# Patient Record
Sex: Male | Born: 1995 | Race: Black or African American | Hispanic: No | Marital: Single | Smoking: Never smoker
Health system: Southern US, Community
[De-identification: ages and names within clinical notes are randomized; demographics above are authoritative.]

## PROBLEM LIST (undated history)

## (undated) DIAGNOSIS — H9012 Conductive hearing loss, unilateral, left ear, with unrestricted hearing on the contralateral side: Secondary | ICD-10-CM

## (undated) DIAGNOSIS — H7192 Unspecified cholesteatoma, left ear: Secondary | ICD-10-CM

## (undated) HISTORY — PX: TYMPANOPLASTY: SHX33

---
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
## 2018-11-04 ENCOUNTER — Ambulatory Visit (HOSPITAL_COMMUNITY)
Admission: EM | Admit: 2018-11-04 | Discharge: 2018-11-04 | Disposition: A | Discharge status: Home / Self Care | Payer: Self-pay | Attending: Family Medicine | Admitting: Family Medicine

## 2018-11-04 ENCOUNTER — Encounter (HOSPITAL_COMMUNITY): Payer: Self-pay | Admitting: Emergency Medicine

## 2018-11-04 DIAGNOSIS — Z202 Contact with and (suspected) exposure to infections with a predominantly sexual mode of transmission: Secondary | ICD-10-CM

## 2018-11-04 DIAGNOSIS — K5900 Constipation, unspecified: Secondary | ICD-10-CM

## 2018-11-04 MED ORDER — CEFTRIAXONE SODIUM 250 MG IJ SOLR
INTRAMUSCULAR | Status: AC
  Filled 2018-11-04: qty 250

## 2018-11-04 MED ORDER — AZITHROMYCIN 250 MG PO TABS
ORAL_TABLET | ORAL | Status: AC
  Filled 2018-11-04: qty 4

## 2018-11-04 MED ORDER — AZITHROMYCIN 250 MG PO TABS
1000.0000 mg | ORAL_TABLET | Freq: Once | ORAL | Status: AC
  Administered 2018-11-04: 1000 mg via ORAL

## 2018-11-04 MED ORDER — CEFTRIAXONE SODIUM 250 MG IJ SOLR
250.0000 mg | Freq: Once | INTRAMUSCULAR | Status: AC
  Administered 2018-11-04: 250 mg via INTRAMUSCULAR

## 2018-11-04 NOTE — Discharge Instructions (Addendum)
You have been treated today for exposure to sexually transmitted diseases.  Additional tests is pending; we will call you if you need additional treatment.   Avoid sexual intercourse for 7 days needed. All your sexual partners should be evaluated and possibly treated.  For your constipation, you can take over-the-counter MiraLAX or a stool softener.  Return here or follow-up with your primary care provider if you experience fever, chills, abdominal pain, problems urinating.

## 2018-11-04 NOTE — ED Provider Notes (Signed)
[facility]    CSN: [id number] Arrival date & time: 11/04/18  1056     History   Chief Complaint Chief Complaint  Patient presents with  . Generalized Body Aches  . Fatigue  . Constipation    HPI Brendan Mccullough is a 23 y.o. male.   Presents today with constipation, body aches, penile discharge, and dysuria.  He states he believes he had a fever 2 days ago which has resolved.  Last BM 3 days ago.  He is sexually active with multiple partners.  He denies abdominal pain, vomiting, diarrhea, cough, congestion.  The history is provided by the patient.    Past Medical History:  Diagnosis Date  . 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   Past Surgical History:  Procedure Laterality Date  . TYMPANOMASTOIDECTOMY  04/25/2012   Procedure: TYMPANOMASTOIDECTOMY;  Surgeon: Serena ColonelJefry Rosen, MD;  Location: [facility];  Service: ENT;  Laterality: Right;  . TYMPANOPLASTY  07/26/2006   left  . TYMPANOPLASTY     with atticotomy right ear       Home Medications    Prior to Admission medications   Medication Sig Start Date End Date Taking? Authorizing Provider  ciprofloxacin-dexamethasone (CIPRODEX) otic suspension Place 3 drops into the right ear 3 (three) times daily. 04/25/12   Serena Colonelosen, Jefry, MD  HYDROcodone-acetaminophen (NORCO) 7.5-325 MG per tablet Take 1 tablet by mouth every 6 (six) hours as needed for pain. 04/25/12   Serena Colonelosen, Jefry, MD  promethazine (PHENERGAN) 25 MG suppository Place 1 suppository (25 mg total) rectally every 6 (six) hours as needed for nausea. 04/25/12   Serena Colonelosen, Jefry, MD    Family History Family History  Problem Relation Age of Onset  . Diabetes Father   . Hypertension Father     Social History Social History   Tobacco Use  . Smoking status: Never Smoker  . Smokeless tobacco: Never Used  Substance Use Topics  .  Alcohol use: No  . Drug use: No     Allergies   Patient has no known allergies.   Review of Systems Review of Systems  Constitutional: Positive for chills and fever.  HENT: Negative for ear pain and sore throat.   Eyes: Negative for pain and visual disturbance.  Respiratory: Negative for cough and shortness of breath.   Cardiovascular: Negative for chest pain and palpitations.  Gastrointestinal: Positive for constipation. Negative for abdominal pain and vomiting.  Genitourinary: Positive for discharge. Negative for dysuria and hematuria.  Musculoskeletal: Negative for arthralgias and back pain.  Skin: Negative for color change and rash.  Neurological: Negative for seizures and syncope.  All other systems reviewed and are negative.    Physical Exam Triage Vital Signs ED Triage Vitals [11/04/18 1154]  Enc Vitals Group     BP 117/73     Pulse Rate 60     Resp 18     Temp 97.8 F (36.6 C)     Temp src      SpO2 100 %     Weight      Height      Head Circumference      Peak Flow      Pain Score 0     Pain Loc      Pain Edu?      Excl. in GC?    No data found.  Updated  Vital Signs BP 117/73   Pulse 60   Temp 97.8 F (36.6 C)   Resp 18   SpO2 100%   Visual Acuity Right Eye Distance:   Left Eye Distance:   Bilateral Distance:    Right Eye Near:   Left Eye Near:    Bilateral Near:     Physical Exam Vitals signs and nursing note reviewed.  Constitutional:      Appearance: He is well-developed.  HENT:     Head: Normocephalic and atraumatic.     Right Ear: Tympanic membrane normal.     Left Ear: Tympanic membrane normal.     Mouth/Throat:     Mouth: Mucous membranes are moist.  Eyes:     Conjunctiva/sclera: Conjunctivae normal.  Neck:     Musculoskeletal: Neck supple.  Cardiovascular:     Rate and Rhythm: Normal rate and regular rhythm.  Pulmonary:     Effort: Pulmonary effort is normal. No respiratory distress.     Breath sounds: Normal breath  sounds.  Abdominal:     General: There is no distension.     Palpations: Abdomen is soft.     Tenderness: There is no abdominal tenderness. There is no right CVA tenderness, left CVA tenderness, guarding or rebound.  Genitourinary:    Penis: Normal.      Scrotum/Testes: Normal.  Skin:    General: Skin is warm and dry.  Neurological:     Mental Status: He is alert.      UC Treatments / Results  Labs (all labs ordered are listed, but only abnormal results are displayed) Labs Reviewed  [place]    EKG   Radiology No results found.  Procedures Procedures (including critical care time)  Medications Ordered in UC Medications  cefTRIAXone (ROCEPHIN) injection 250 mg (250 mg Intramuscular Given 11/04/18 1252)  azithromycin (ZITHROMAX) tablet 1,000 mg (1,000 mg Oral Given 11/04/18 1253)  azithromycin (ZITHROMAX) 250 MG tablet (has no administration in time range)  cefTRIAXone (ROCEPHIN) 250 MG injection (has no administration in time range)    Initial Impression / Assessment and Plan / UC Course  I have reviewed the triage vital signs and the nursing notes.  Pertinent labs & imaging results that were available during my care of the patient were reviewed by me and considered in my medical decision making (see chart for details).   Exposure to sexually transmitted diseases.  Treated today with Rocephin and Zithromax.  Urine sent.  Discussed with patient that if he needs additional treatment, we will call him.  Discussed need for safe sex practices.  Discussed that his partners will need treatment also and that he will need to abstain from sex for 7 days. Constipation.  Discussed over-the-counter treatments with patient such as MiraLAX and stool softeners. Final Clinical Impressions(s) / UC Diagnoses   Final diagnoses:  Exposure to sexually transmitted disease (STD)  Constipation, unspecified constipation type     Discharge Instructions     You have been  treated today for exposure to sexually transmitted diseases.  Additional tests is pending; we will call you if you need additional treatment.   Avoid sexual intercourse for 7 days needed. All your sexual partners should be evaluated and possibly treated.  For your constipation, you can take over-the-counter MiraLAX or a stool softener.    ED Prescriptions    None     Controlled Substance Prescriptions [place] Controlled Substance Registry consulted? Not Applicable   Sharion Balloon, NP 11/04/18 1336

## 2018-11-04 NOTE — ED Triage Notes (Signed)
Pt states for the last few weeks he "doesn't feel well". Pt endorses body aches, feeling really "hot and sweating". Pt also c/o constipation. Denies pain at this time.

## 2018-11-08 LAB — URINE CYTOLOGY ANCILLARY ONLY
Chlamydia: NEGATIVE
Neisseria Gonorrhea: NEGATIVE
Trichomonas: NEGATIVE

## 2018-11-10 ENCOUNTER — Telehealth (HOSPITAL_COMMUNITY): Payer: Self-pay | Admitting: Emergency Medicine

## 2018-11-10 NOTE — Telephone Encounter (Signed)
Pt called asking about test results. All negative. All questions answered.

## 2018-11-24 ENCOUNTER — Other Ambulatory Visit: Payer: Self-pay

## 2018-11-24 ENCOUNTER — Emergency Department (HOSPITAL_COMMUNITY)
Admission: EM | Admit: 2018-11-24 | Discharge: 2018-11-24 | Disposition: A | Discharge status: Home / Self Care | Payer: Self-pay | Attending: Emergency Medicine | Admitting: Emergency Medicine

## 2018-11-24 ENCOUNTER — Emergency Department (HOSPITAL_COMMUNITY): Payer: Self-pay

## 2018-11-24 DIAGNOSIS — R369 Urethral discharge, unspecified: Secondary | ICD-10-CM

## 2018-11-24 DIAGNOSIS — K5900 Constipation, unspecified: Secondary | ICD-10-CM | POA: Insufficient documentation

## 2018-11-24 DIAGNOSIS — R360 Urethral discharge without blood: Secondary | ICD-10-CM | POA: Insufficient documentation

## 2018-11-24 LAB — COMPREHENSIVE METABOLIC PANEL
ALT: 13 U/L (ref 0–44)
AST: 17 U/L (ref 15–41)
Albumin: 4.4 g/dL (ref 3.5–5.0)
Alkaline Phosphatase: 54 U/L (ref 38–126)
Anion gap: 9 (ref 5–15)
BUN: 14 mg/dL (ref 6–20)
CO2: 24 mmol/L (ref 22–32)
Calcium: 9.6 mg/dL (ref 8.9–10.3)
Chloride: 102 mmol/L (ref 98–111)
Creatinine, Ser: 1.07 mg/dL (ref 0.61–1.24)
GFR calc Af Amer: >60 mL/min (ref >60)
GFR calc non Af Amer: >60 mL/min (ref >60)
Glucose, Bld: 91 mg/dL (ref 70–99)
Potassium: 4 mmol/L (ref 3.5–5.1)
Sodium: 135 mmol/L (ref 135–145)
Total Bilirubin: 1.3 mg/dL — ABNORMAL HIGH (ref 0.3–1.2)
Total Protein: 7.1 g/dL (ref 6.5–8.1)

## 2018-11-24 LAB — URINALYSIS, ROUTINE W REFLEX MICROSCOPIC
Bilirubin Urine: NEGATIVE
Glucose, UA: NEGATIVE mg/dL
Hgb urine dipstick: NEGATIVE
Ketones, ur: NEGATIVE mg/dL
Leukocytes,Ua: NEGATIVE
Nitrite: NEGATIVE
Protein, ur: NEGATIVE mg/dL
Specific Gravity, Urine: 1.01 (ref 1.005–1.030)
pH: 6 (ref 5.0–8.0)

## 2018-11-24 LAB — CBC
HCT: 41.4 % (ref 39.0–52.0)
Hemoglobin: 14.8 g/dL (ref 13.0–17.0)
MCH: 28.4 pg (ref 26.0–34.0)
MCHC: 35.7 g/dL (ref 30.0–36.0)
MCV: 79.3 fL — ABNORMAL LOW (ref 80.0–100.0)
Platelets: 204 10*3/uL (ref 150–400)
RBC: 5.22 MIL/uL (ref 4.22–5.81)
RDW: 13.3 % (ref 11.5–15.5)
WBC: 5.5 10*3/uL (ref 4.0–10.5)
nRBC: 0 % (ref 0.0–0.2)

## 2018-11-24 LAB — LIPASE, BLOOD: Lipase: 25 U/L (ref 11–51)

## 2018-11-24 MED ORDER — AZITHROMYCIN 250 MG PO TABS
1000.0000 mg | ORAL_TABLET | Freq: Once | ORAL | Status: AC
  Administered 2018-11-24: 1000 mg via ORAL
  Filled 2018-11-24: qty 4

## 2018-11-24 MED ORDER — CEFTRIAXONE SODIUM 250 MG IJ SOLR
250.0000 mg | Freq: Once | INTRAMUSCULAR | Status: AC
  Administered 2018-11-24: 250 mg via INTRAMUSCULAR
  Filled 2018-11-24: qty 250

## 2018-11-24 MED ORDER — SODIUM CHLORIDE 0.9% FLUSH: 3.0000 mL | Freq: Once | INTRAVENOUS | Status: DC

## 2018-11-24 MED ORDER — LIDOCAINE HCL (PF) 1 % IJ SOLN
INTRAMUSCULAR | Status: AC
  Administered 2018-11-24: 20:00:00 5 mL
  Filled 2018-11-24: qty 5

## 2018-11-24 NOTE — Discharge Instructions (Signed)
May take 2 scoops of MiraLAX in 8 ounces of water for constipation.  May repeat after 8 hours if no bowel movement.  Also make sure to take in adequate fluids to help with constipation.  If your symptoms are unresolved please follow-up with PCP or health department for your penile discharge.  Return for new or worsening symptoms.

## 2018-11-24 NOTE — ED Provider Notes (Signed)
[facility] Provider Note   CSN: [id number] Arrival date & time: 11/24/18  1622   History   Chief Complaint Chief Complaint  Patient presents with   SEXUALLY TRANSMITTED DISEASE   Constipation    HPI Brendan Mccullough is a 23 y.o. male with past medical history significant for hearing loss who presents for evaluation of constipation and penile discharge.  Patient states he was seen at urgent care approximately 3 weeks ago for similar symptoms.  He was told this is likely due to an STD and received antibiotics.  Patient states he has had intermittent, clear penile discharge since.  Patient states he is still struggling with constipation and "abdominal fullness."  Patient states he normally struggles with constipation and only has bowel movements approximately once a week.  Patient states it has been about a week since his last bowel movement.  Patient states he is passing gas.  Tolerating p.o. intake without nausea or vomiting.  Patient states he has not taken anything at home for his constipation.  Denies fever, chills, nausea, vomiting, chest pain, shortness of breath, dysuria, flank pain, pain with bowel movements, testicular swelling, penile rash or lesions.  Denies hematuria.  Patient states he is sexually active with multiple partners.  He occasionally uses protection.  History obtained from patient and past medical records.  No interpreter was used.     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   Past Surgical History:  Procedure Laterality Date   TYMPANOMASTOIDECTOMY  04/25/2012   Procedure: TYMPANOMASTOIDECTOMY;  Surgeon: Serena ColonelJefry Rosen, MD;  Location: [facility];  Service: ENT;  Laterality: Right;   TYMPANOPLASTY  07/26/2006   left   TYMPANOPLASTY     with  atticotomy right ear        Home Medications    Prior to Admission medications   Medication Sig Start Date End Date Taking? Authorizing Provider  ciprofloxacin-dexamethasone (CIPRODEX) otic suspension Place 3 drops into the right ear 3 (three) times daily. 04/25/12   Serena Colonelosen, Jefry, MD  HYDROcodone-acetaminophen (NORCO) 7.5-325 MG per tablet Take 1 tablet by mouth every 6 (six) hours as needed for pain. 04/25/12   Serena Colonelosen, Jefry, MD  promethazine (PHENERGAN) 25 MG suppository Place 1 suppository (25 mg total) rectally every 6 (six) hours as needed for nausea. 04/25/12   Serena Colonelosen, Jefry, MD    Family History Family History  Problem Relation Age of Onset   Diabetes Father    Hypertension Father     Social History Social History   Tobacco Use   Smoking status: Never Smoker   Smokeless tobacco: Never Used  Substance Use Topics   Alcohol use: No   Drug use: No     Allergies   Patient has no known allergies.   Review of Systems Review of Systems  Constitutional: Negative.   HENT: Negative.   Respiratory: Negative.   Cardiovascular: Negative.   Gastrointestinal: Positive for abdominal distention and constipation. Negative for abdominal pain, anal bleeding, blood in stool, diarrhea, nausea, rectal pain and vomiting.  Genitourinary: Positive for discharge. Negative for decreased urine volume, difficulty urinating, dysuria, flank pain, frequency, genital sores, hematuria, penile pain, penile swelling, scrotal swelling, testicular pain and urgency.  Musculoskeletal: Negative.   Skin: Negative.   Neurological: Negative.   All other systems reviewed and  are negative.    Physical Exam Updated Vital Signs BP 117/84    Pulse (!) 56    Temp 98.7 F (37.1 C) (Oral)    Resp 16    SpO2 100%   Physical Exam Vitals signs and nursing note reviewed. Exam conducted with a chaperone present.  Constitutional:      General: He is not in acute distress.    Appearance: He is  well-developed. He is not ill-appearing, toxic-appearing or diaphoretic.  HENT:     Head: Normocephalic and atraumatic.     Nose: Nose normal.     Mouth/Throat:     Mouth: Mucous membranes are moist.     Pharynx: Oropharynx is clear.  Eyes:     Pupils: Pupils are equal, round, and reactive to light.  Neck:     Musculoskeletal: Normal range of motion and neck supple.  Cardiovascular:     Rate and Rhythm: Normal rate and regular rhythm.     Pulses: Normal pulses.     Heart sounds: Normal heart sounds. No murmur. No friction rub. No gallop.   Pulmonary:     Effort: Pulmonary effort is normal. No respiratory distress.     Breath sounds: Normal breath sounds.  Abdominal:     General: Bowel sounds are normal. There is no distension.     Palpations: Abdomen is soft.     Tenderness: There is no abdominal tenderness. There is no right CVA tenderness, left CVA tenderness, guarding or rebound.     Hernia: No hernia is present. There is no hernia in the left inguinal area or right inguinal area.     Comments: Soft, nontender without rebound or guarding.  No overlying skin changes to abdominal wall.  Genitourinary:    Scrotum/Testes: Normal. Cremasteric reflex is present.     Epididymis:     Right: Normal.     Left: Normal.     Comments: No penile discharge.  No testicular swelling or tenderness.  No epididymal tenderness.  Cremasteric reflex present.  No evidence of inguinal hernias. Musculoskeletal: Normal range of motion.  Lymphadenopathy:     Lower Body: No right inguinal adenopathy. No left inguinal adenopathy.  Skin:    General: Skin is warm and dry.     Comments: No rashes or lesions.  Brisk capillary refill.  Neurological:     Mental Status: He is alert.     Comments: Ambulatory without difficulty.    ED Treatments / Results  Labs (all labs ordered are listed, but only abnormal results are displayed) Labs Reviewed  COMPREHENSIVE METABOLIC PANEL - Abnormal; Notable for the  following components:      Result Value   Total Bilirubin 1.3 (*)    All other components within normal limits  CBC - Abnormal; Notable for the following components:   MCV 79.3 (*)    All other components within normal limits  LIPASE, BLOOD  URINALYSIS, ROUTINE W REFLEX MICROSCOPIC  HIV ANTIBODY (ROUTINE TESTING W REFLEX)  RPR  GC/CHLAMYDIA PROBE AMP ([place]    EKG None  Radiology Dg Abdomen 1 View  Result Date: 11/24/2018 CLINICAL DATA:  Constipation EXAM: ABDOMEN - 1 VIEW COMPARISON:  None. FINDINGS: Scattered large and small bowel gas is noted. No obstructive changes are seen. Minimal retained fecal material is noted without evidence of constipation. No free air is seen. No abnormal mass or abnormal calcifications are noted. No bony abnormality is seen. IMPRESSION: No acute abnormality noted. Electronically Signed  By: Alcide CleverMark  Lukens M.D.   On: 11/24/2018 18:33    Procedures Procedures (including critical care time)  Medications Ordered in ED Medications  sodium chloride flush (NS) 0.9 % injection 3 mL (has no administration in time range)  azithromycin (ZITHROMAX) tablet 1,000 mg (has no administration in time range)  cefTRIAXone (ROCEPHIN) injection 250 mg (has no administration in time range)   Initial Impression / Assessment and Plan / ED Course  I have reviewed the triage vital signs and the nursing notes.  Pertinent labs & imaging results that were available during my care of the patient were reviewed by me and considered in my medical decision making (see chart for details).  23 year old male appears otherwise well presents for evaluation of constipation and penile discharge.  Was seen by urgent care 3 weeks ago for similar symptoms.  He is afebrile, nonseptic, non-ill-appearing.  Sx initially resolved however now has continued intermittent clear penile discharge since he was given antibiotics.  Per records review his GC and chlamydia test was negative  at that time.  He is sexually active with multiple partners and does not use protection.  His abdomen is soft, nontender without rebound or guarding.  Patient is also having issues with constipation.  He denies pain with bowel movements however states he normally only has bowel movements once a week.  States he feels abdominal fullness without any focal tenderness.  Denies pain with bowel movements.  GU exam unremarkable.  No penile discharge.  Labs obtained from triage  Labs and imaging personally reviewed CBC without leukocytosis Metabolic panel with mild hyperbilirubinemia at 1.3.  He has no right upper quadrant tenderness palpation.  I have low suspicion for cholecystitis or gallbladder pathology.  No additional elevation of LFTs to suggest CBD stone, choledocholithiasis or cholangitis. Lipase 25 Urinalysis negative for infection HIV/rpr pending GC/ Chlamydia pending DG abd without dense of obstruction or perforation.  Patient is afebrile without abdominal tenderness or painful bowel movements to indicate prostatitis.  No tenderness to palpation of the testes or epididymis to suggest orchitis or epididymitis.  STD cultures obtained including HIV, syphilis, gonorrhea and chlamydia. Patient to be discharged with instructions to follow up with PCP. Discussed importance of using protection when sexually active. Pt understands that they have GC/Chlamydia cultures pending and that they will need to inform all sexual partners if results return positive. Patient has been treated prophylactically with azithromycin and Rocephin.  Discussed with patient bowel regimen for his chronic constipation.  He was actually able to have a bowel movement in emergency department just prior to x-ray without any difficulty.  He declined GU exam to assess for infection, prostatitis.  Patient is nontoxic, nonseptic appearing, in no apparent distress. Patient does not meet the SIRS or Sepsis criteria.  On repeat exam patient  does not have a surgical abdomin and there are no peritoneal signs.  No indication of appendicitis, bowel obstruction, bowel perforation, cholecystitis, diverticulitis.   The patient has been appropriately medically screened and/or stabilized in the ED. I have low suspicion for any other emergent medical condition which would require further screening, evaluation or treatment in the ED or require inpatient management.  Patient is hemodynamically stable and in no acute distress.  Patient able to ambulate in department prior to ED.  Evaluation does not show acute pathology that would require ongoing or additional emergent interventions while in the emergency department or further inpatient treatment.  I have discussed the diagnosis with the patient and answered all questions.  Pain is been managed while in the emergency department and patient has no further complaints prior to discharge.  Patient is comfortable with plan discussed in room and is stable for discharge at this time.  I have discussed strict return precautions for returning to the emergency department.  Patient was encouraged to follow-up with PCP/specialist refer to at discharge.      Final Clinical Impressions(s) / ED Diagnoses   Final diagnoses:  Penile discharge  Constipation, unspecified constipation type    ED Discharge Orders    None       Barabara Motz A, PA-C 11/24/18 1941    Virgel Manifold, MD 11/25/18 [phone]

## 2018-11-24 NOTE — ED Triage Notes (Signed)
t reports being seen on 7/3 for Std, given a shot. Pt reports he is still having penile discharge. He also reports constipation, abdominal pain, and feeling gassy. Pt denies nausea/vomiting.

## 2018-11-24 NOTE — ED Notes (Signed)
Discharge instructions and prescription discussed with Pt. Pt verbalized understanding. Pt stable and ambulatory.    

## 2018-11-25 LAB — RPR: RPR Ser Ql: NONREACTIVE

## 2018-11-25 LAB — HIV ANTIBODY (ROUTINE TESTING W REFLEX): HIV Screen 4th Generation wRfx: NONREACTIVE

## 2018-11-26 LAB — GC/CHLAMYDIA PROBE AMP (~~LOC~~) NOT AT ARMC
Chlamydia: NEGATIVE
Neisseria Gonorrhea: NEGATIVE

## 2019-02-15 ENCOUNTER — Other Ambulatory Visit: Payer: Self-pay

## 2019-02-15 DIAGNOSIS — Z20822 Contact with and (suspected) exposure to covid-19: Secondary | ICD-10-CM

## 2019-02-16 LAB — NOVEL CORONAVIRUS, NAA: SARS-CoV-2, NAA: NOT DETECTED

## 2019-02-20 ENCOUNTER — Other Ambulatory Visit: Payer: Self-pay

## 2019-02-20 DIAGNOSIS — Z20822 Contact with and (suspected) exposure to covid-19: Secondary | ICD-10-CM

## 2019-02-22 LAB — NOVEL CORONAVIRUS, NAA: SARS-CoV-2, NAA: NOT DETECTED

## 2022-05-18 ENCOUNTER — Telehealth: Payer: Self-pay

## 2022-05-18 NOTE — Telephone Encounter (Signed)
Mychart msg sent. AS, CMA
